# Patient Record
Sex: Male | Born: 1990 | Race: White | Hispanic: No | Marital: Single | Smoking: Never smoker
Health system: Southern US, Community
[De-identification: ages and names within clinical notes are randomized; demographics above are authoritative.]

---

## 2013-03-01 ENCOUNTER — Encounter (HOSPITAL_COMMUNITY): Payer: Self-pay | Admitting: *Deleted

## 2013-03-01 ENCOUNTER — Emergency Department (INDEPENDENT_AMBULATORY_CARE_PROVIDER_SITE_OTHER)
Admission: EM | Admit: 2013-03-01 | Discharge: 2013-03-01 | Disposition: A | Discharge status: Home / Self Care | Payer: Managed Care, Other (non HMO) | Source: Home / Self Care

## 2013-03-01 DIAGNOSIS — T07XXXA Unspecified multiple injuries, initial encounter: Secondary | ICD-10-CM

## 2013-03-01 DIAGNOSIS — IMO0002 Reserved for concepts with insufficient information to code with codable children: Secondary | ICD-10-CM

## 2013-03-01 DIAGNOSIS — Z23 Encounter for immunization: Secondary | ICD-10-CM

## 2013-03-01 MED ORDER — TETANUS-DIPHTH-ACELL PERTUSSIS 5-2.5-18.5 LF-MCG/0.5 IM SUSP
0.5000 mL | Freq: Once | INTRAMUSCULAR | Status: AC
  Administered 2013-03-01: 0.5 mL via INTRAMUSCULAR

## 2013-03-01 MED ORDER — TETANUS-DIPHTH-ACELL PERTUSSIS 5-2.5-18.5 LF-MCG/0.5 IM SUSP
INTRAMUSCULAR | Status: AC
  Filled 2013-03-01: qty 0.5

## 2013-03-01 NOTE — ED Provider Notes (Signed)
CSN: [id number]     Arrival date & time 03/01/13  1629 History   First MD Initiated Contact with Patient 03/01/13 1755     Chief Complaint  Patient presents with  . Extremity Laceration   (Consider location/radiation/quality/duration/timing/severity/associated sxs/prior Treatment) HPI Comments: 22 year old male accidentally scraped his left forearm on a metal fragment naproxen 5 days ago. The wound was superficial and did not bleed according to the patient. He also received a couple of white or superficial abrasions adjacent to the linear abrasion.  The patient has noticed some mild erythema surrounding the wound and thought it might be infected. He is here for a wound check.    History reviewed. No pertinent past medical history. History reviewed. No pertinent past surgical history. No family history on file. History  Substance Use Topics  . Smoking status: Never Smoker   . Smokeless tobacco: Not on file  . Alcohol Use: Yes    Review of Systems  Constitutional: Negative.   Respiratory: Negative.   Musculoskeletal: Negative for myalgias, back pain and gait problem.  Skin: Positive for wound.  Neurological: Negative for dizziness, weakness, numbness and headaches.  Psychiatric/Behavioral: Negative.     Allergies  Review of patient's allergies indicates no known allergies.  Home Medications  No current outpatient prescriptions on file. BP 124/72  Pulse 70  Temp(Src) 98.6 F (37 C) (Oral)  Resp 18  SpO2 100% Physical Exam  Nursing note and vitals reviewed. Constitutional: He is oriented to person, place, and time. He appears well-developed and well-nourished. No distress.  Eyes: EOM are normal.  Neck: Normal range of motion. Neck supple.  Cardiovascular: Normal rate.   Pulmonary/Chest: Effort normal. No respiratory distress.  Musculoskeletal: Normal range of motion. He exhibits no edema and no tenderness.  Neurological: He is alert and oriented to person, place, and  time. He exhibits normal muscle tone.  Skin: Skin is warm and dry.  There is an approximately 4-1/2 cm linear superficial abrasion to the extensor surface of the left midforearm. It is healing well by secondary intention. Adjacent to the linear abrasion are to slightly wider superficial abrasions. There is 1-2 mm of very light erythema surrounding the linear abrasion. No deep erythema or spreading erythema. No induration. No drainage. No lymphangitis. No tenderness.  Psychiatric: He has a normal mood and affect.    ED Course  Procedures (including critical care time) Labs Review Labs Reviewed - No data to display Imaging Review No results found.  MDM   1. Multiple abrasions      There is no sign of infection to the wound at this time. The light erythema that the patient is concerned about is actually hyperemia associated with normal healing process. :Ad Minister T.dap For any signs of infection such as described, discussed and within the written instructions return promptly.  Hayden Rasmussen, NP 03/01/13 [phone]

## 2013-03-01 NOTE — ED Provider Notes (Signed)
Medical screening examination/treatment/procedure(s) were performed by non-physician practitioner and as supervising physician I was immediately available for consultation/collaboration.  Kent Braunschweig, M.D.  Deuntae Kocsis C Lucienne Sawyers, MD 03/01/13 1925 

## 2013-03-01 NOTE — ED Notes (Signed)
Pt  Sustained  A  Somewhat  Superficial  Laceration to l  Arm  About 4  Days  Ago  With a  Piece  Of  Metal    -  The  Wound  Appears  reddnes  With a  Yellow  Streak  Which  He  Is  Concerned  May be  Infected        -  He is  Unsure  Of his last  Tetanus  Shot

## 2013-04-19 ENCOUNTER — Emergency Department (HOSPITAL_COMMUNITY)
Admission: EM | Admit: 2013-04-19 | Discharge: 2013-04-19 | Disposition: A | Discharge status: Home / Self Care | Payer: Managed Care, Other (non HMO) | Source: Home / Self Care | Attending: Family Medicine | Admitting: Family Medicine

## 2013-04-19 ENCOUNTER — Encounter (HOSPITAL_COMMUNITY): Payer: Self-pay | Admitting: Emergency Medicine

## 2013-04-19 DIAGNOSIS — J069 Acute upper respiratory infection, unspecified: Secondary | ICD-10-CM

## 2013-04-19 MED ORDER — INFLUENZA VAC SPLIT QUAD 0.5 ML IM SUSP: 0.5000 mL | INTRAMUSCULAR | Status: DC

## 2013-04-19 MED ORDER — INFLUENZA VAC SPLIT QUAD 0.5 ML IM SUSP
0.5000 mL | Freq: Once | INTRAMUSCULAR | Status: AC
  Administered 2013-04-19: 0.5 mL via INTRAMUSCULAR

## 2013-04-19 MED ORDER — INFLUENZA VAC SPLIT QUAD 0.5 ML IM SUSP
INTRAMUSCULAR | Status: AC
  Filled 2013-04-19: qty 0.5

## 2013-04-19 MED ORDER — IPRATROPIUM BROMIDE 0.06 % NA SOLN: 2.0000 | Freq: Four times a day (QID) | NASAL | Status: AC

## 2013-04-19 MED ORDER — HYDROCOD POLST-CHLORPHEN POLST 10-8 MG/5ML PO LQCR: 5.0000 mL | Freq: Two times a day (BID) | ORAL | Status: AC | PRN

## 2013-04-19 NOTE — ED Notes (Signed)
C/O sore throat with some intermittent HAs, nasal congestion, and slight dry cough x 3-4 days.  Denies fevers.  Has been taking OTC cold & flu med.

## 2013-04-19 NOTE — ED Provider Notes (Signed)
CSN: [id number]     Arrival date & time 04/19/13  0902 History   First MD Initiated Contact with Patient 04/19/13 [phone]     Chief Complaint  Patient presents with  . Sore Throat   (Consider location/radiation/quality/duration/timing/severity/associated sxs/prior Treatment) Patient is a 22 y.o. male presenting with pharyngitis. The history is provided by the patient.  Sore Throat This is a new problem. The current episode started more than 2 days ago. The problem has not changed since onset.Associated symptoms include headaches. Pertinent negatives include no chest pain and no abdominal pain.    History reviewed. No pertinent past medical history. History reviewed. No pertinent past surgical history. No family history on file. History  Substance Use Topics  . Smoking status: Never Smoker   . Smokeless tobacco: Not on file  . Alcohol Use: Yes     Comment: occasionally    Review of Systems  Constitutional: Negative.  Negative for fever.  HENT: Positive for congestion, postnasal drip and rhinorrhea.   Eyes: Negative.   Respiratory: Positive for cough.   Cardiovascular: Negative for chest pain.  Gastrointestinal: Negative.  Negative for abdominal pain.  Genitourinary: Negative.   Neurological: Positive for headaches.    Allergies  Review of patient's allergies indicates no known allergies.  Home Medications   Current Outpatient Rx  Name  Route  Sig  Dispense  Refill  . chlorpheniramine-HYDROcodone (TUSSIONEX PENNKINETIC ER) 10-8 MG/5ML LQCR   Oral   Take 5 mLs by mouth every 12 (twelve) hours as needed for cough.   115 mL   0   . ipratropium (ATROVENT) 0.06 % nasal spray   Nasal   Place 2 sprays into the nose 4 (four) times daily.   15 mL   1    Pulse 78  Temp(Src) 98.2 F (36.8 C) (Oral)  Resp 17  SpO2 95% Physical Exam  Nursing note and vitals reviewed. Constitutional: He is oriented to person, place, and time. He appears well-developed and well-nourished.   HENT:  Right Ear: External ear normal.  Left Ear: External ear normal.  Nose: Nose normal.  Mouth/Throat: Oropharynx is clear and moist.  Eyes: Conjunctivae and EOM are normal. Pupils are equal, round, and reactive to light.  Neck: Normal range of motion. Neck supple.  Cardiovascular: Normal rate and normal heart sounds.   Pulmonary/Chest: Effort normal and breath sounds normal.  Neurological: He is alert and oriented to person, place, and time.  Skin: Skin is warm and dry.    ED Course  Procedures (including critical care time) Labs Review Labs Reviewed  POCT RAPID STREP A ([facility] CARE ONLY)   Imaging Review No results found.    MDM      Linna Hoff, MD 04/19/13 [phone]

## 2013-04-21 LAB — CULTURE, GROUP A STREP

## 2013-10-05 ENCOUNTER — Emergency Department (HOSPITAL_COMMUNITY)
Admission: EM | Admit: 2013-10-05 | Discharge: 2013-10-05 | Disposition: A | Discharge status: Home / Self Care | Payer: Managed Care, Other (non HMO) | Source: Home / Self Care | Attending: Family Medicine | Admitting: Family Medicine

## 2013-10-05 ENCOUNTER — Encounter (HOSPITAL_COMMUNITY): Payer: Self-pay | Admitting: Emergency Medicine

## 2013-10-05 DIAGNOSIS — L02419 Cutaneous abscess of limb, unspecified: Secondary | ICD-10-CM

## 2013-10-05 DIAGNOSIS — L03119 Cellulitis of unspecified part of limb: Secondary | ICD-10-CM

## 2013-10-05 DIAGNOSIS — IMO0002 Reserved for concepts with insufficient information to code with codable children: Secondary | ICD-10-CM

## 2013-10-05 MED ORDER — AMOXICILLIN-POT CLAVULANATE 875-125 MG PO TABS: 1.0000 | ORAL_TABLET | Freq: Two times a day (BID) | ORAL | Status: AC

## 2013-10-05 MED ORDER — DOXYCYCLINE HYCLATE 100 MG PO CAPS: 100.0000 mg | ORAL_CAPSULE | Freq: Two times a day (BID) | ORAL | Status: AC

## 2013-10-05 NOTE — Discharge Instructions (Signed)
Soak ankle twice a day, take medicine as prescribed, see orthopedist as instructed

## 2013-10-05 NOTE — ED Notes (Signed)
States his heel was sore the other day, and has developed swollen, red area same. Wears steel toed shoes on job

## 2013-10-05 NOTE — ED Provider Notes (Signed)
CSN: [id number]     Arrival date & time 10/05/13  1407 History   First MD Initiated Contact with Patient 10/05/13 1542     Chief Complaint  Patient presents with  . Foot Pain   (Consider location/radiation/quality/duration/timing/severity/associated sxs/prior Treatment) Patient is a 23 y.o. male presenting with lower extremity pain. The history is provided by the patient.  Foot Pain This is a new problem. The current episode started more than 2 days ago (new work boots x 2 wks, soreness on fri, blister developed on sun, continues today.). The problem has been gradually worsening.    History reviewed. No pertinent past medical history. History reviewed. No pertinent past surgical history. History reviewed. No pertinent family history. History  Substance Use Topics  . Smoking status: Never Smoker   . Smokeless tobacco: Not on file  . Alcohol Use: Yes     Comment: occasionally    Review of Systems  Constitutional: Negative.   Skin: Positive for rash.    Allergies  Review of patient's allergies indicates no known allergies.  Home Medications   Prior to Admission medications   Medication Sig Start Date End Date Taking? Authorizing Provider  chlorpheniramine-HYDROcodone (TUSSIONEX PENNKINETIC ER) 10-8 MG/5ML LQCR Take 5 mLs by mouth every 12 (twelve) hours as needed for cough. 04/19/13   Linna HoffJames D Tyann Niehaus, MD  ipratropium (ATROVENT) 0.06 % nasal spray Place 2 sprays into the nose 4 (four) times daily. 04/19/13   Linna HoffJames D Britain Anagnos, MD   BP 145/77  Pulse 74  Temp(Src) 98.5 F (36.9 C) (Oral)  Resp 14  SpO2 99% Physical Exam  Nursing note and vitals reviewed. Constitutional: He is oriented to person, place, and time. He appears well-developed and well-nourished.  Neurological: He is alert and oriented to person, place, and time.  Skin: Skin is warm and dry.       ED Course  INCISION AND DRAINAGE Date/Time: 10/05/2013 5:16 PM Performed by: Linna HoffKINDL, Laurene Melendrez D Authorized by: Bradd CanaryKINDL,  Atonya Templer D Consent: Verbal consent obtained. Risks and benefits: risks, benefits and alternatives were discussed Consent given by: patient Type: abscess Body area: lower extremity Location details: left ankle Local anesthetic: topical anesthetic Patient sedated: no Scalpel size: 15 Incision type: single straight Complexity: simple Drainage: purulent Drainage amount: scant Wound treatment: wound left open Packing material: none Patient tolerance: Patient tolerated the procedure well with no immediate complications. Comments: Culture obtained.   (including critical care time) Labs Review Labs Reviewed  CULTURE, ROUTINE-ABSCESS    Imaging Review No results found.   MDM   1. Abscess or cellulitis of ankle    Discussed with drolin-ortho , simple i+d suggested with abx, will see in f/u in office.    Linna HoffJames D Amiere Cawley, MD 10/05/13 2110

## 2013-10-05 NOTE — ED Notes (Signed)
Patient had questions about his Rx. Discussed w Dr Artis FlockKindl, relayed information to patient

## 2013-10-08 LAB — CULTURE, ROUTINE-ABSCESS: GRAM STAIN: NONE SEEN

## 2013-10-09 ENCOUNTER — Telehealth (HOSPITAL_COMMUNITY): Payer: Self-pay | Admitting: *Deleted

## 2013-10-09 NOTE — ED Notes (Addendum)
Abscess culture: Abundant MRSA.  Pt. adequately treated with Doxycycline.  I called pt. and left a message to call.  Call 1. Desiree LucySuzanne M Anjel Pardo 10/09/2013 Pt. called back.  Pt. verified x 2 and given results.  Pt. told he was adequately treated with the Doxycycline.  [facility] with him.  Pt. voiced understanding. Desiree LucySuzanne M Sophi Calligan 10/09/2013

## 2019-05-22 ENCOUNTER — Other Ambulatory Visit: Payer: Self-pay

## 2019-05-22 ENCOUNTER — Emergency Department (HOSPITAL_COMMUNITY)
Admission: EM | Admit: 2019-05-22 | Discharge: 2019-05-22 | Disposition: A | Discharge status: Home / Self Care | Payer: Managed Care, Other (non HMO) | Attending: Emergency Medicine | Admitting: Emergency Medicine

## 2019-05-22 ENCOUNTER — Emergency Department (HOSPITAL_COMMUNITY): Payer: Managed Care, Other (non HMO)

## 2019-05-22 ENCOUNTER — Encounter (HOSPITAL_COMMUNITY): Payer: Self-pay

## 2019-05-22 DIAGNOSIS — R1031 Right lower quadrant pain: Secondary | ICD-10-CM | POA: Diagnosis not present

## 2019-05-22 DIAGNOSIS — R319 Hematuria, unspecified: Secondary | ICD-10-CM | POA: Diagnosis not present

## 2019-05-22 DIAGNOSIS — Z79899 Other long term (current) drug therapy: Secondary | ICD-10-CM | POA: Insufficient documentation

## 2019-05-22 DIAGNOSIS — R109 Unspecified abdominal pain: Secondary | ICD-10-CM | POA: Diagnosis present

## 2019-05-22 LAB — CBC WITH DIFFERENTIAL/PLATELET
Abs Immature Granulocytes: 0.02 10*3/uL (ref 0.00–0.07)
Basophils Absolute: 0 10*3/uL (ref 0.0–0.1)
Basophils Relative: 1 %
Eosinophils Absolute: 0.1 10*3/uL (ref 0.0–0.5)
Eosinophils Relative: 1 %
HCT: 44.8 % (ref 39.0–52.0)
Hemoglobin: 14.9 g/dL (ref 13.0–17.0)
Immature Granulocytes: 0 %
Lymphocytes Relative: 16 %
Lymphs Abs: 1.3 10*3/uL (ref 0.7–4.0)
MCH: 28.9 pg (ref 26.0–34.0)
MCHC: 33.3 g/dL (ref 30.0–36.0)
MCV: 87 fL (ref 80.0–100.0)
Monocytes Absolute: 0.4 10*3/uL (ref 0.1–1.0)
Monocytes Relative: 5 %
Neutro Abs: 6.1 10*3/uL (ref 1.7–7.7)
Neutrophils Relative %: 77 %
Platelets: 226 10*3/uL (ref 150–400)
RBC: 5.15 MIL/uL (ref 4.22–5.81)
RDW: 12.5 % (ref 11.5–15.5)
WBC: 7.8 10*3/uL (ref 4.0–10.5)
nRBC: 0 % (ref 0.0–0.2)

## 2019-05-22 LAB — BASIC METABOLIC PANEL
Anion gap: 10 (ref 5–15)
BUN: 15 mg/dL (ref 6–20)
CO2: 26 mmol/L (ref 22–32)
Calcium: 9.7 mg/dL (ref 8.9–10.3)
Chloride: 101 mmol/L (ref 98–111)
Creatinine, Ser: 0.92 mg/dL (ref 0.61–1.24)
GFR calc Af Amer: >60 mL/min (ref >60)
GFR calc non Af Amer: >60 mL/min (ref >60)
Glucose, Bld: 147 mg/dL — ABNORMAL HIGH (ref 70–99)
Potassium: 3.8 mmol/L (ref 3.5–5.1)
Sodium: 137 mmol/L (ref 135–145)

## 2019-05-22 LAB — URINALYSIS, ROUTINE W REFLEX MICROSCOPIC
Bilirubin Urine: NEGATIVE
Glucose, UA: NEGATIVE mg/dL
Ketones, ur: 5 mg/dL — AB
Leukocytes,Ua: NEGATIVE
Nitrite: NEGATIVE
Protein, ur: 100 mg/dL — AB
RBC / HPF: >50 RBC/hpf — ABNORMAL HIGH (ref 0–5)
Specific Gravity, Urine: 1.023 (ref 1.005–1.030)
pH: 6 (ref 5.0–8.0)

## 2019-05-22 MED ORDER — HYDROCODONE-ACETAMINOPHEN 5-325 MG PO TABS: 2.0000 | ORAL_TABLET | ORAL | 0 refills | Status: AC | PRN

## 2019-05-22 MED ORDER — SODIUM CHLORIDE (PF) 0.9 % IJ SOLN
INTRAMUSCULAR | Status: AC
  Filled 2019-05-22: qty 50

## 2019-05-22 MED ORDER — ONDANSETRON HCL 4 MG/2ML IJ SOLN
4.0000 mg | Freq: Once | INTRAMUSCULAR | Status: AC
  Administered 2019-05-22: 10:00:00 4 mg via INTRAVENOUS
  Filled 2019-05-22: qty 2

## 2019-05-22 MED ORDER — MORPHINE SULFATE (PF) 4 MG/ML IV SOLN
4.0000 mg | Freq: Once | INTRAVENOUS | Status: DC
  Filled 2019-05-22: qty 1

## 2019-05-22 MED ORDER — IOHEXOL 300 MG/ML  SOLN
100.0000 mL | Freq: Once | INTRAMUSCULAR | Status: AC | PRN
  Administered 2019-05-22: 11:00:00 100 mL via INTRAVENOUS

## 2019-05-22 NOTE — ED Notes (Signed)
Patient refused pain meds at this time

## 2019-05-22 NOTE — ED Notes (Signed)
Patient ambulated independently to the restroom.

## 2019-05-22 NOTE — ED Triage Notes (Signed)
Patient c/o right mid abdominal pain and nausea after having a BM this AM. Patient states his BM was normal  This AM. Patient also reports that th epain radiates into the right back.

## 2019-05-22 NOTE — ED Provider Notes (Signed)
[facility] DEPT Provider Note   CSN: [id number] Arrival date & time: 05/22/19  8416     History Chief Complaint  Patient presents with  . Abdominal Pain  . Nausea    Joseph Roth is a 28 y.o. male.  28 year old male presents with 3 hours of acute onset of right lower quadrant abdominal pain.  Pain is been persistent and not colicky.  No urinary symptoms.  No fever or chills.  Nausea but no vomiting.  No prior history of same.  No treatment use prior to arrival        History reviewed. No pertinent past medical history.  There are no problems to display for this patient.   History reviewed. No pertinent surgical history.     Family History  Problem Relation Age of Onset  . Cancer Mother   . Hypertension Father     Social History   Tobacco Use  . Smoking status: Never Smoker  . Smokeless tobacco: Never Used  Substance Use Topics  . Alcohol use: Yes    Comment: occasionally  . Drug use: No    Home Medications Prior to Admission medications   Medication Sig Start Date End Date Taking? Authorizing Provider  amoxicillin-clavulanate (AUGMENTIN) 875-125 MG per tablet Take 1 tablet by mouth 2 (two) times daily. 10/05/13   Billy Fischer, MD  chlorpheniramine-HYDROcodone (TUSSIONEX PENNKINETIC ER) 10-8 MG/5ML LQCR Take 5 mLs by mouth every 12 (twelve) hours as needed for cough. 04/19/13   Billy Fischer, MD  doxycycline (VIBRAMYCIN) 100 MG capsule Take 1 capsule (100 mg total) by mouth 2 (two) times daily. 10/05/13   Billy Fischer, MD  ipratropium (ATROVENT) 0.06 % nasal spray Place 2 sprays into the nose 4 (four) times daily. 04/19/13   Billy Fischer, MD    Allergies    Patient has no known allergies.  Review of Systems   Review of Systems  All other systems reviewed and are negative.   Physical Exam Updated Vital Signs BP 136/84   Pulse 64   Temp 98.1 F (36.7 C) (Oral)   Resp 15   Ht 1.829 m (6')   Wt 68 kg   SpO2  100%   BMI 20.34 kg/m   Physical Exam Vitals and nursing note reviewed.  Constitutional:      General: He is not in acute distress.    Appearance: Normal appearance. He is well-developed. He is not toxic-appearing.  HENT:     Head: Normocephalic and atraumatic.  Eyes:     General: Lids are normal.     Conjunctiva/sclera: Conjunctivae normal.     Pupils: Pupils are equal, round, and reactive to light.  Neck:     Thyroid: No thyroid mass.     Trachea: No tracheal deviation.  Cardiovascular:     Rate and Rhythm: Normal rate and regular rhythm.     Heart sounds: Normal heart sounds. No murmur. No gallop.   Pulmonary:     Effort: Pulmonary effort is normal. No respiratory distress.     Breath sounds: Normal breath sounds. No stridor. No decreased breath sounds, wheezing, rhonchi or rales.  Abdominal:     General: Bowel sounds are normal. There is no distension.     Palpations: Abdomen is soft.     Tenderness: There is no abdominal tenderness. There is no rebound.     Hernia: No hernia is present.    Musculoskeletal:        General: No  tenderness. Normal range of motion.     Cervical back: Normal range of motion and neck supple.  Skin:    General: Skin is warm and dry.     Findings: No abrasion or rash.  Neurological:     Mental Status: He is alert and oriented to person, place, and time.     GCS: GCS eye subscore is 4. GCS verbal subscore is 5. GCS motor subscore is 6.     Cranial Nerves: No cranial nerve deficit.     Sensory: No sensory deficit.  Psychiatric:        Speech: Speech normal.        Behavior: Behavior normal.     ED Results / Procedures / Treatments   Labs (all labs ordered are listed, but only abnormal results are displayed) Labs Reviewed  URINALYSIS, ROUTINE W REFLEX MICROSCOPIC  CBC WITH DIFFERENTIAL/PLATELET  BASIC METABOLIC PANEL    EKG None  Radiology No results found.  Procedures Procedures (including critical care time)  Medications  Ordered in ED Medications  morphine 4 MG/ML injection 4 mg (has no administration in time range)    ED Course  I have reviewed the triage vital signs and the nursing notes.  Pertinent labs & imaging results that were available during my care of the patient were reviewed by me and considered in my medical decision making (see chart for details).    MDM Rules/Calculators/A&P     CHA2DS2/VAS Stroke Risk Points      N/A >= 2 Points: High Risk  1 - 1.99 Points: Medium Risk  0 Points: Low Risk    A final score could not be computed because of missing components.: Last  Change: N/A     This score determines the patient's risk of having a stroke if the  patient has atrial fibrillation.      This score is not applicable to this patient. Components are not  calculated.                   Patient is urinalysis shows hematuria.  CT was negative for appendicitis or kidney stone.  Will give patient follow-up with urology for his hematuria Final Clinical Impression(s) / ED Diagnoses Final diagnoses:  None    Rx / DC Orders ED Discharge Orders    None       Lorre Nick, MD 05/22/19 1246

## 2019-06-25 ENCOUNTER — Ambulatory Visit: Payer: Managed Care, Other (non HMO) | Attending: Internal Medicine

## 2019-06-25 DIAGNOSIS — Z20822 Contact with and (suspected) exposure to covid-19: Secondary | ICD-10-CM

## 2019-06-26 LAB — NOVEL CORONAVIRUS, NAA: SARS-CoV-2, NAA: NOT DETECTED

## 2021-06-11 IMAGING — CT CT ABD-PELV W/ CM
2 of 4 series · 17 of 46 positions shown, 19 images · IV contrast (omnipaque)
Comparison: None.

CLINICAL DATA: Right abdomen pain and nausea.

EXAM:
CT ABDOMEN AND PELVIS WITH CONTRAST
TECHNIQUE: Multidetector CT imaging of the abdomen and pelvis was performed
using the standard protocol following bolus administration of
intravenous contrast.
CONTRAST:  100mL OMNIPAQUE IOHEXOL 300 MG/ML  SOLN

[Series 2: axial st · axial · 0.68mm/px · z∈[-517,-117]mm · 14 of 90 slices shown, 16 images]
[im 5/90  soft-tissue]
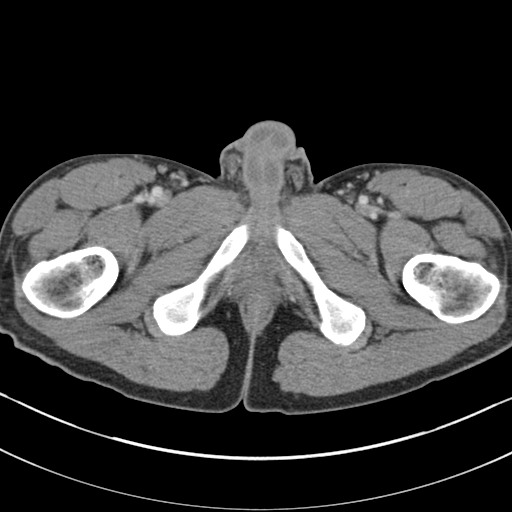
[im 5/90  bone]
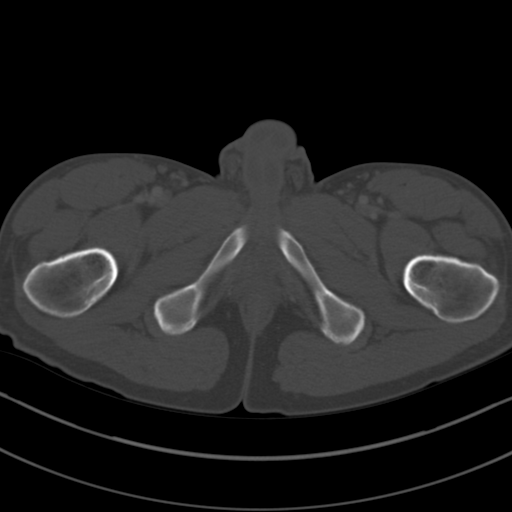
[im 10/90  soft-tissue]
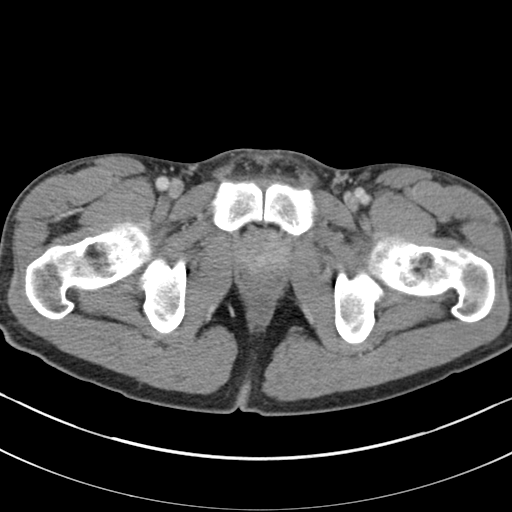
[im 20/90  soft-tissue]
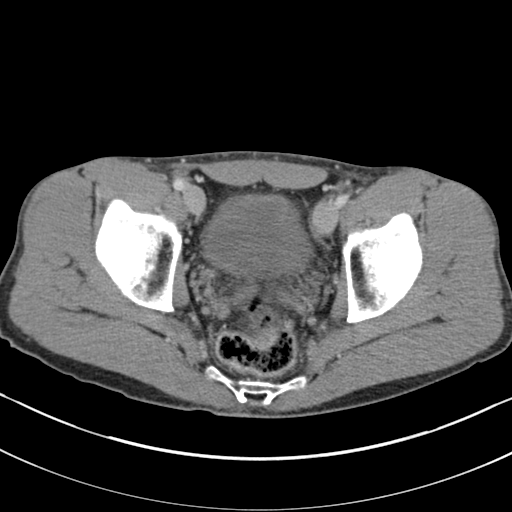
[im 25/90  soft-tissue]
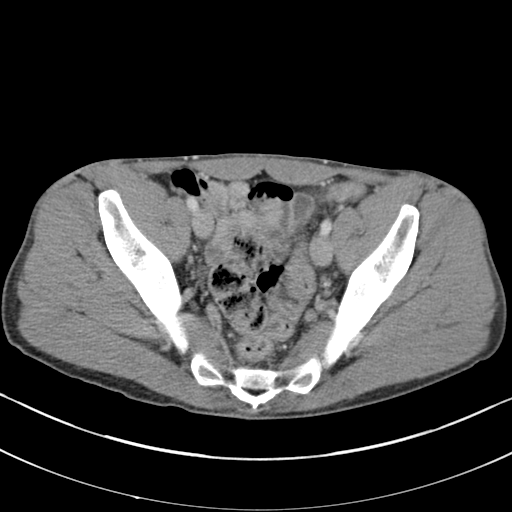
[im 30/90  soft-tissue]
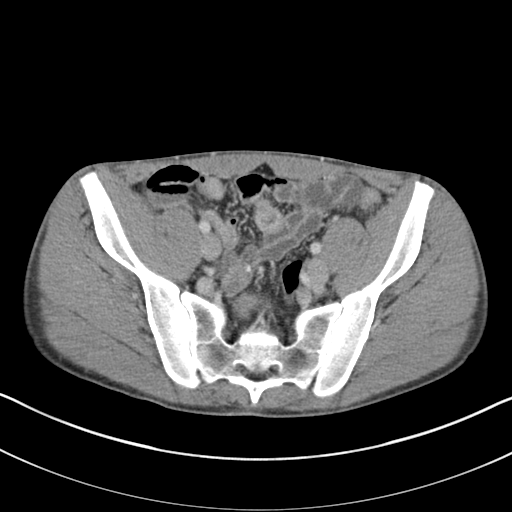
[im 35/90  soft-tissue]
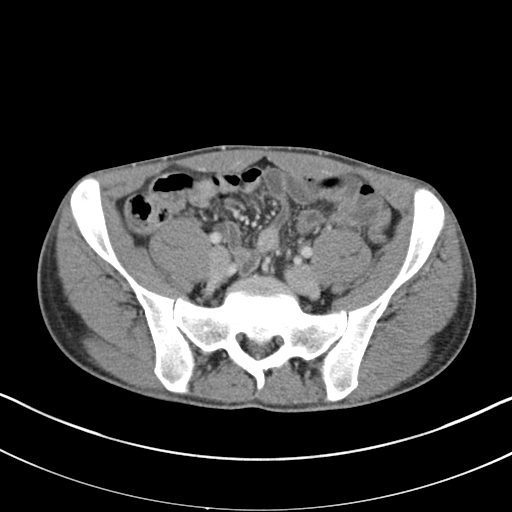
[im 40/90  soft-tissue]
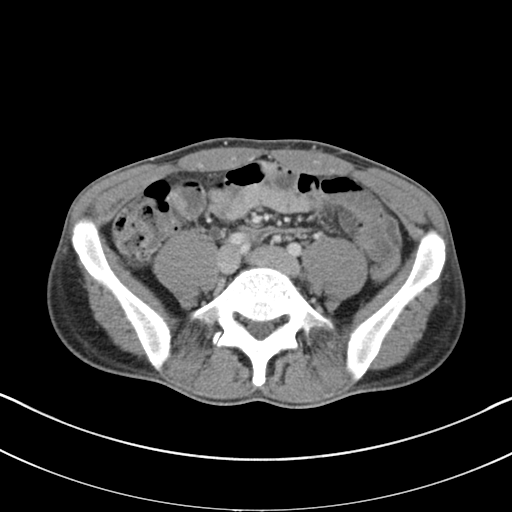
[im 50/90  soft-tissue]
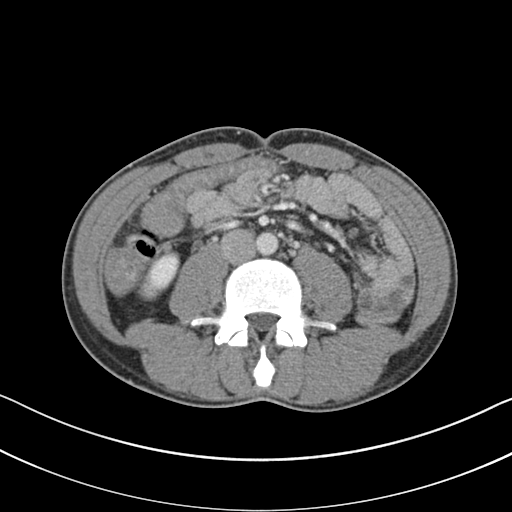
[im 55/90  soft-tissue]
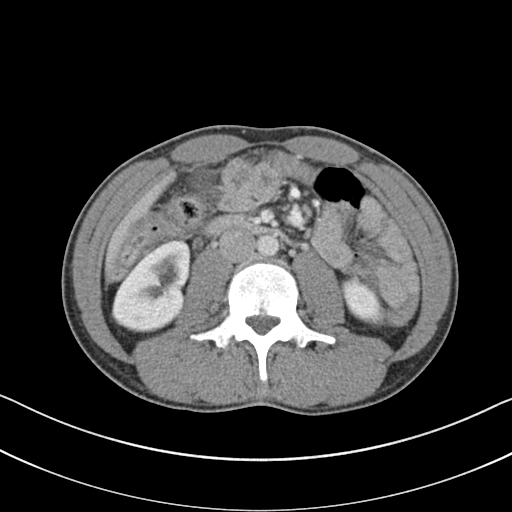
[im 55/90  bone]
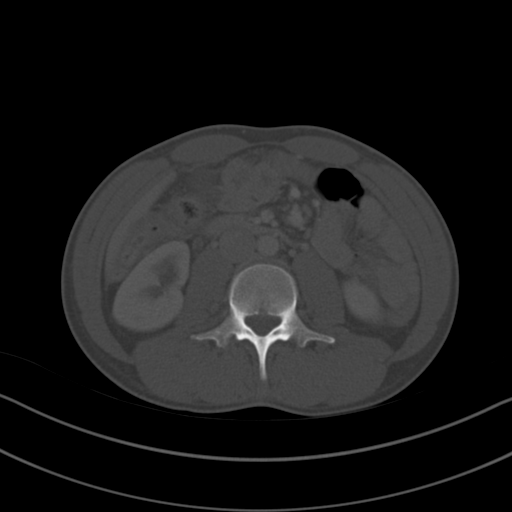
[im 60/90  soft-tissue]
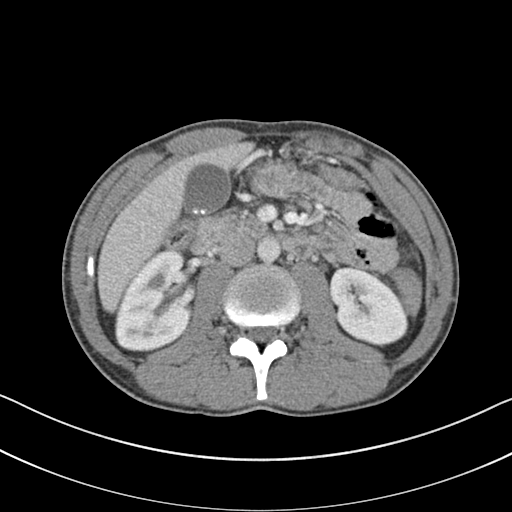
[im 65/90  soft-tissue]
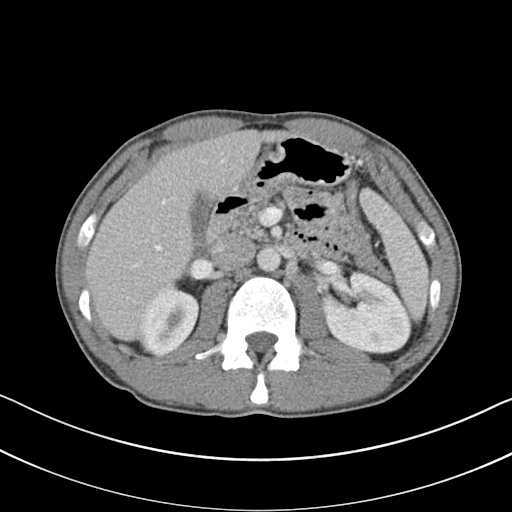
[im 70/90  soft-tissue]
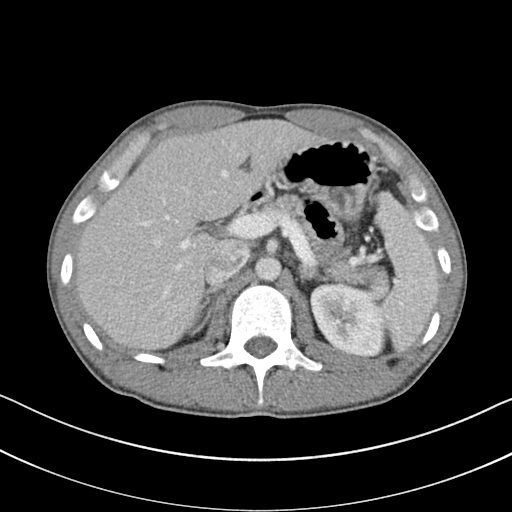
[im 80/90  soft-tissue]
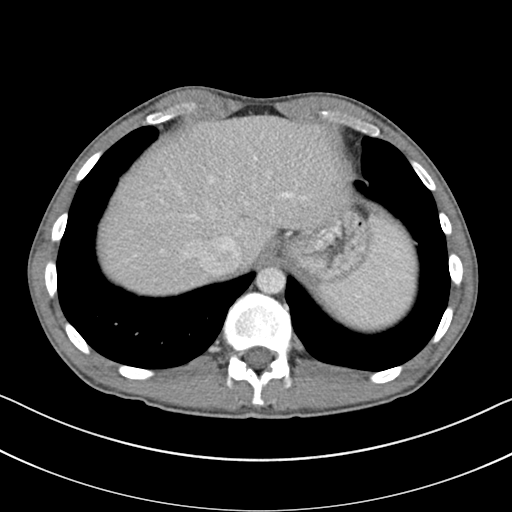
[im 85/90  soft-tissue]
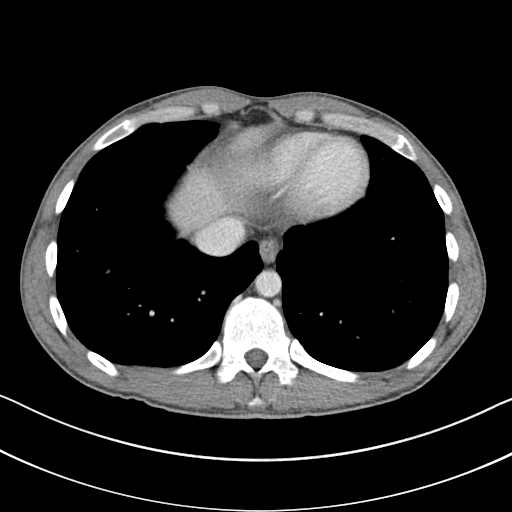

[Series 4: coronal st · coronal · 0.91mm/px · 3 of 104 slices shown]
[im 35/104  soft-tissue]
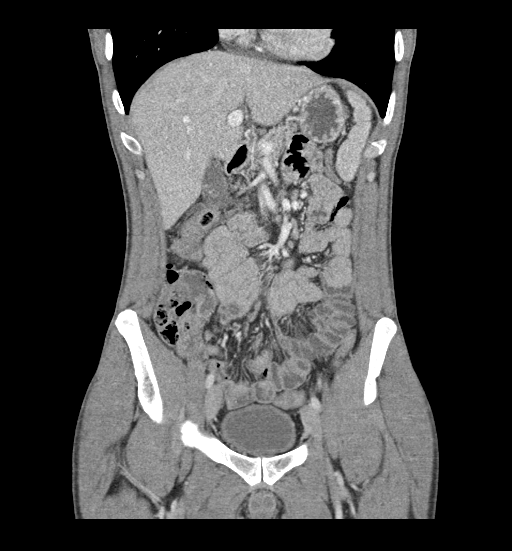
[im 46/104  soft-tissue]
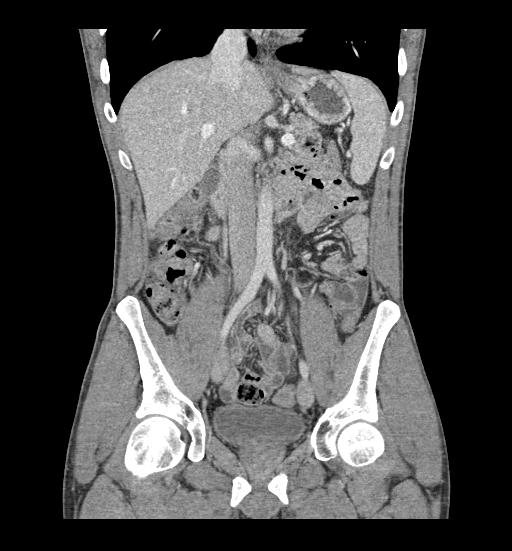
[im 58/104  soft-tissue]
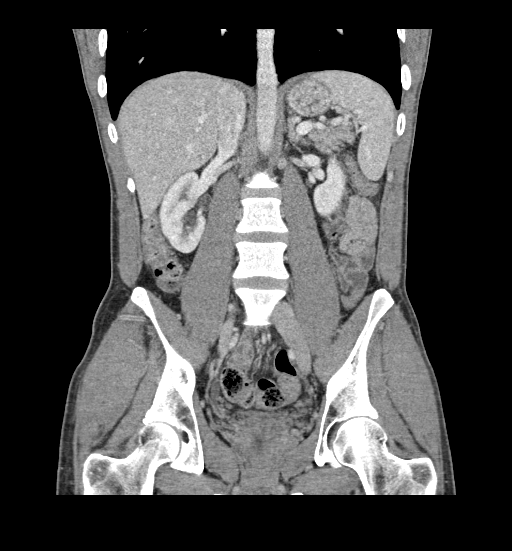

[17 of 46 positions shown; findings below may reference images not displayed]

FINDINGS: Lower chest: No acute abnormality.

Hepatobiliary: A few subcentimeter cysts are identified near the
dome of liver. The liver is otherwise normal. Small gallstones are
noted in the gallbladder. No inflammation is noted around
gallbladder. The biliary tree is normal.

Pancreas: Unremarkable. No pancreatic ductal dilatation or
surrounding inflammatory changes.

Spleen: Normal in size without focal abnormality.

Adrenals/Urinary Tract: Adrenal glands are unremarkable. Kidneys are
normal, without renal calculi, focal lesion, or hydronephrosis.
Bladder is unremarkable.

Stomach/Bowel: Stomach is within normal limits. The appendix is not
seen but no inflammation is noted around cecum. No evidence of bowel
wall thickening, distention, or inflammatory changes.

Vascular/Lymphatic: No significant vascular findings are present. No
enlarged abdominal or pelvic lymph nodes.

Reproductive: Prostate is unremarkable.

Other: None.

Musculoskeletal: No acute or significant osseous findings.
IMPRESSION: 1. No acute abnormality identified in the abdomen and pelvis.
2. Cholelithiasis without evidence of acute cholecystitis.
